# Patient Record
Sex: Male | Born: 1980
Health system: Southern US, Community
[De-identification: ages and names within clinical notes are randomized; demographics above are authoritative.]

---

## 2002-08-24 ENCOUNTER — Encounter: Payer: Self-pay | Admitting: General Surgery

## 2002-08-24 ENCOUNTER — Emergency Department (HOSPITAL_COMMUNITY): Admission: AC | Admit: 2002-08-24 | Discharge: 2002-08-24 | Payer: Self-pay

## 2005-01-15 ENCOUNTER — Emergency Department: Payer: Self-pay | Admitting: Emergency Medicine

## 2006-09-02 ENCOUNTER — Emergency Department (HOSPITAL_COMMUNITY): Admission: EM | Admit: 2006-09-02 | Discharge: 2006-09-02 | Payer: Self-pay | Admitting: Emergency Medicine

## 2009-03-03 ENCOUNTER — Emergency Department: Payer: Self-pay | Admitting: Emergency Medicine

## 2009-03-06 ENCOUNTER — Emergency Department (HOSPITAL_COMMUNITY): Admission: EM | Admit: 2009-03-06 | Discharge: 2009-03-06 | Payer: Self-pay | Admitting: Emergency Medicine

## 2009-11-17 ENCOUNTER — Emergency Department (HOSPITAL_COMMUNITY): Admission: EM | Admit: 2009-11-17 | Discharge: 2009-11-17 | Payer: Self-pay | Admitting: Emergency Medicine

## 2015-09-30 ENCOUNTER — Other Ambulatory Visit: Payer: Self-pay | Admitting: Otolaryngology

## 2015-09-30 DIAGNOSIS — R221 Localized swelling, mass and lump, neck: Secondary | ICD-10-CM

## 2015-10-04 ENCOUNTER — Ambulatory Visit
Admission: RE | Admit: 2015-10-04 | Discharge: 2015-10-04 | Disposition: A | Discharge status: Home / Self Care | Payer: Managed Care, Other (non HMO) | Source: Ambulatory Visit | Attending: Otolaryngology | Admitting: Otolaryngology

## 2015-10-04 DIAGNOSIS — R221 Localized swelling, mass and lump, neck: Secondary | ICD-10-CM | POA: Diagnosis not present

## 2015-10-04 MED ORDER — IOHEXOL 350 MG/ML SOLN
75.0000 mL | Freq: Once | INTRAVENOUS | Status: AC | PRN
  Administered 2015-10-04: 75 mL via INTRAVENOUS

## 2015-10-12 ENCOUNTER — Telehealth (HOSPITAL_COMMUNITY): Payer: Self-pay | Admitting: Otolaryngology

## 2016-10-14 IMAGING — CT CT NECK W/ CM
4 of 5 series · 15 of 33 positions shown, 18 images · IV contrast (omnipaque)
Comparison: None.

CLINICAL DATA: Right neck mass 2 months.

EXAM:
CT NECK WITH CONTRAST
TECHNIQUE: Multidetector CT imaging of the neck was performed using the
standard protocol following the bolus administration of intravenous
contrast.
CONTRAST:  75mL OMNIPAQUE IOHEXOL 350 MG/ML SOLN

[Series 2: axial · axial · 0.50mm/px · z∈[-354,-292]mm · 2 of 126 slices shown]
[im 32/126  bone]
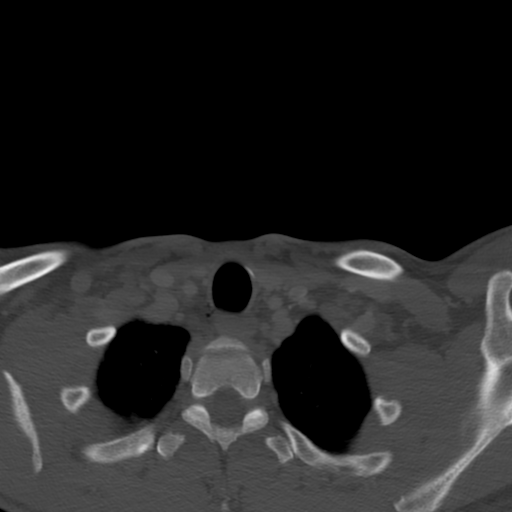
[im 63/126  bone]
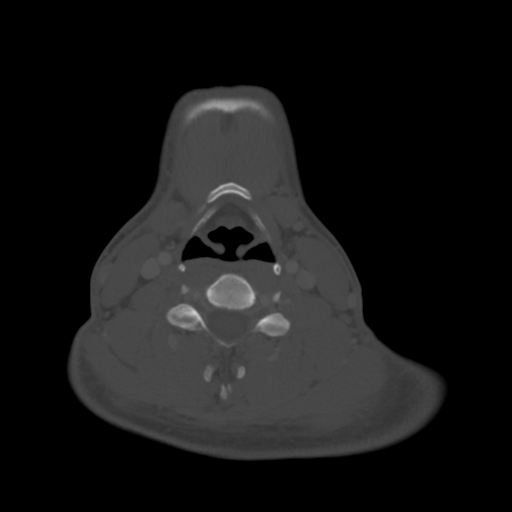

[Series 4: sag neck · sagittal · 0.49mm/px · 5 of 101 slices shown, 6 images]
[im 34/101  bone]
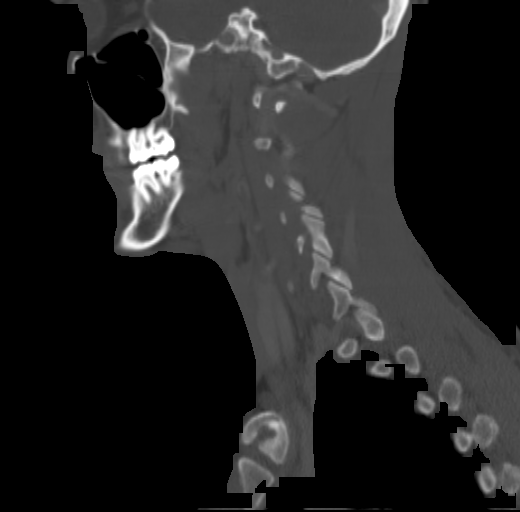
[im 42/101  bone]
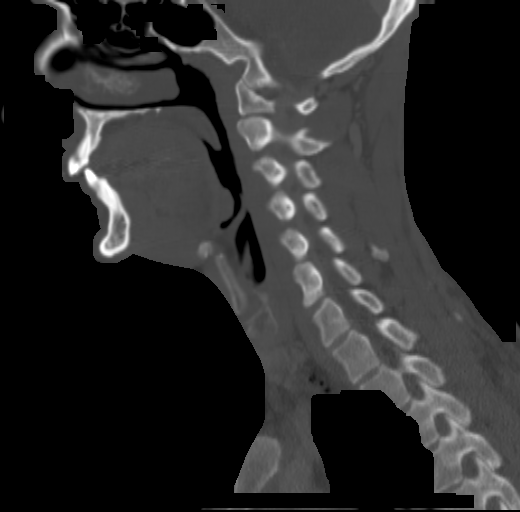
[im 51/101  soft-tissue]
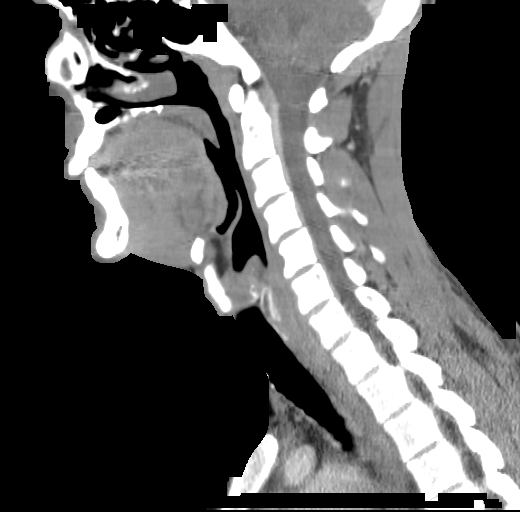
[im 51/101  bone]
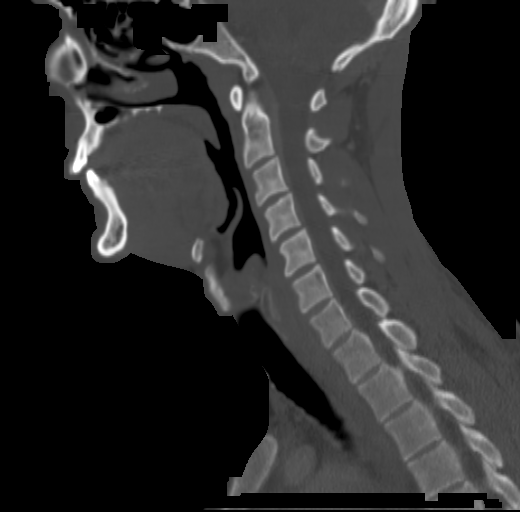
[im 59/101  bone]
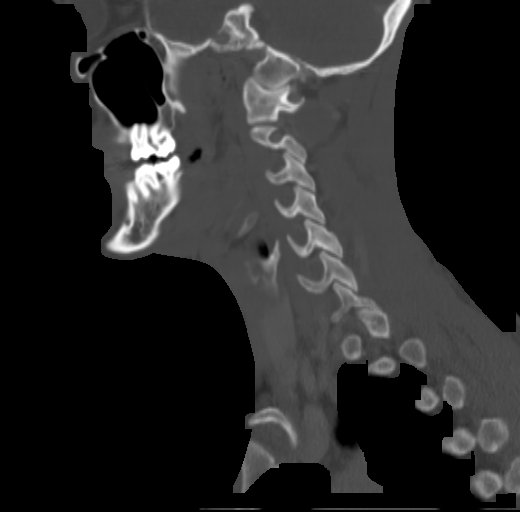
[im 67/101  bone]
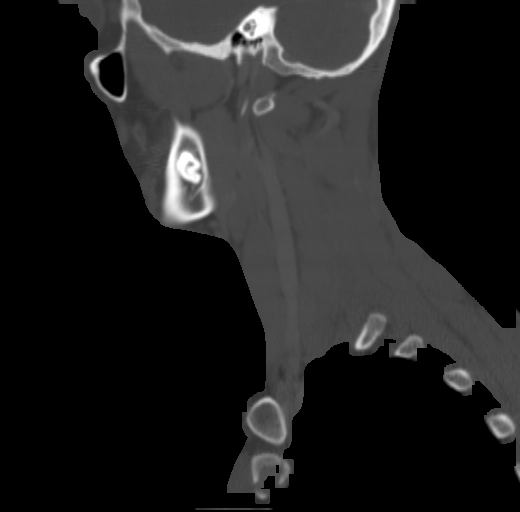

[Series 5: cor neck · coronal · 0.51mm/px · 3 of 113 slices shown]
[im 25/113  bone]
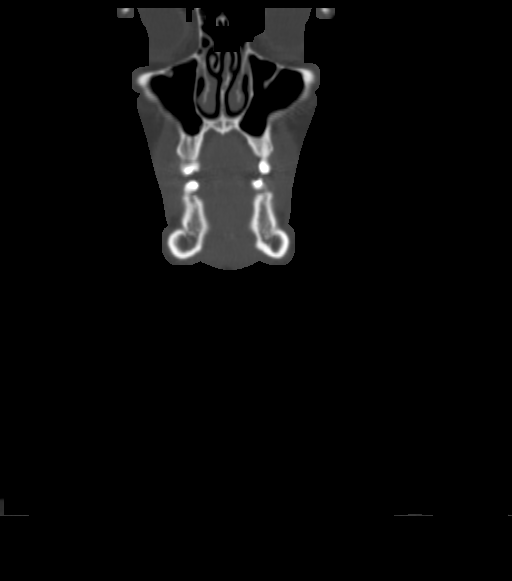
[im 46/113  bone]
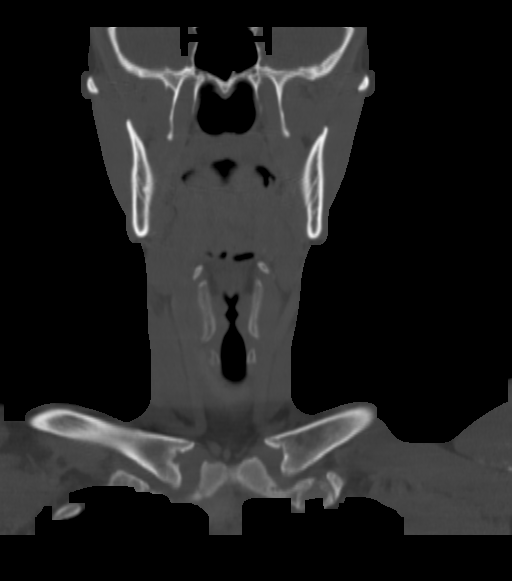
[im 67/113  bone]
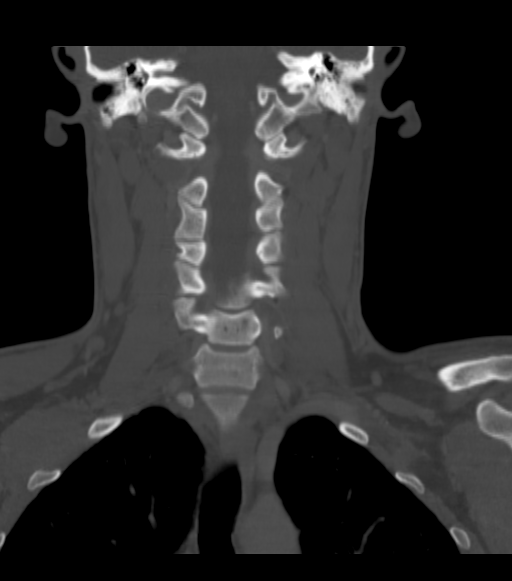

[Series 6: ax oropharynx · axial · 0.47mm/px · z∈[-431,-220]mm · 5 of 167 slices shown, 7 images]
[im 28/167  soft-tissue]
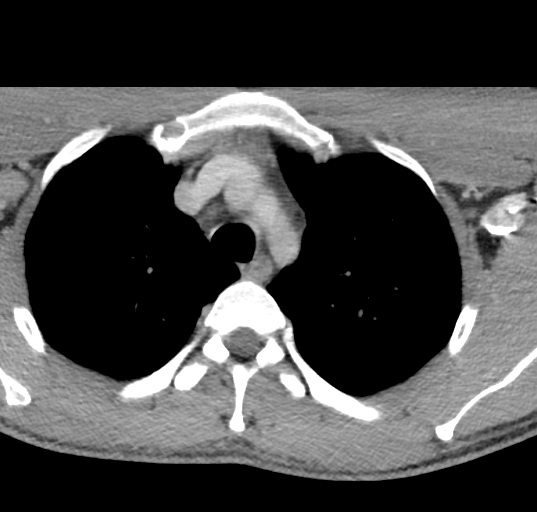
[im 28/167  bone]
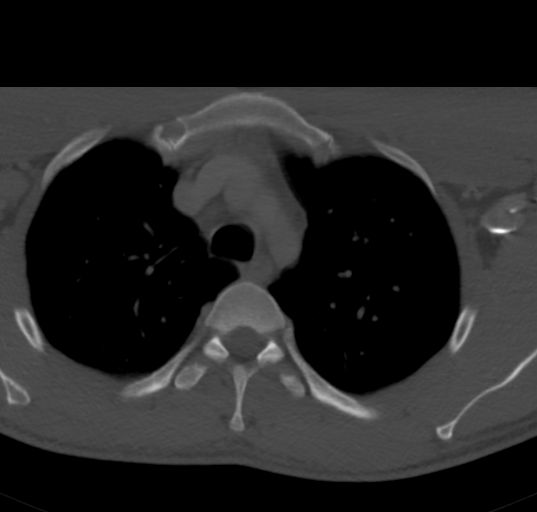
[im 56/167  bone]
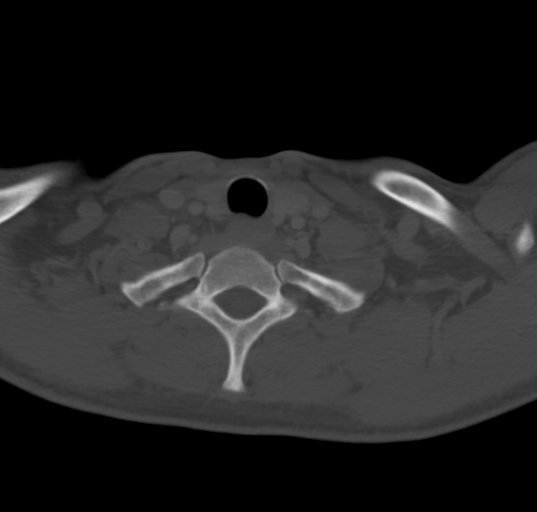
[im 84/167  bone]
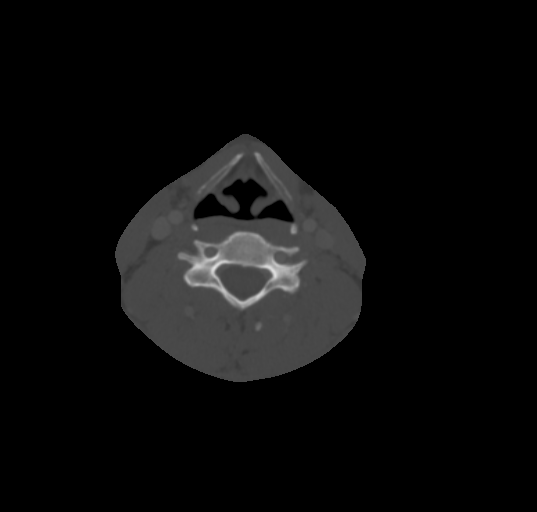
[im 111/167  bone]
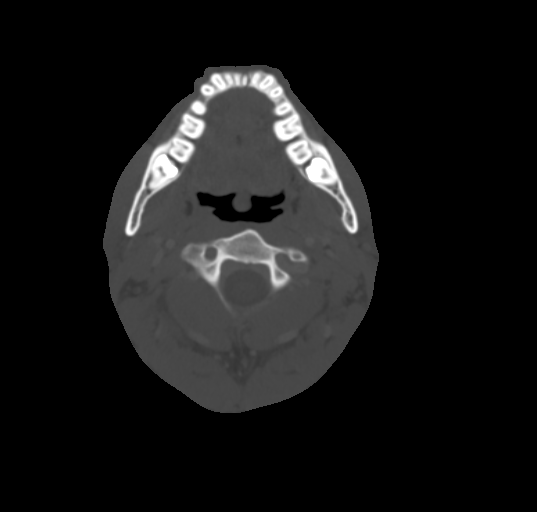
[im 139/167  soft-tissue]
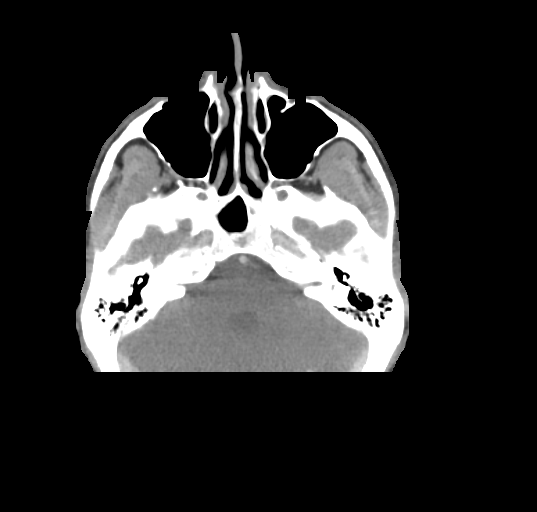
[im 139/167  bone]
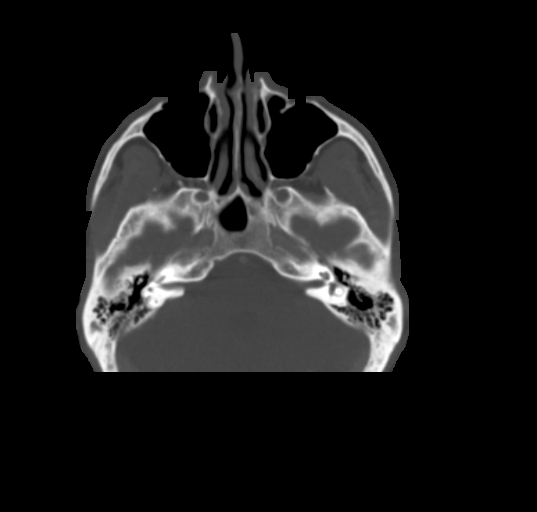

[15 of 33 positions shown; findings below may reference images not displayed]

FINDINGS: Pharynx and larynx: Negative

Salivary glands: Low-density area corresponds to a 15 mm mass
posterior to the tail of the parotid. This appears extra parotid but
has similar density to the parotid. No surrounding inflammation. No
calcification. Remainder of the parotid gland appears normal.

Left parotid normal.  Submandibular gland normal bilaterally.

Thyroid: Normal

Lymph nodes: Negative for cervical adenopathy.

Vascular: Normal

Limited intracranial: Fourth ventricular calcification compatible
with choroid plexus. No acute intracranial abnormality.

Visualized orbits: Negative

Mastoids and visualized paranasal sinuses: Negative

Skeleton: Negative

Upper chest: Negative
IMPRESSION: 15 mm low-density mass posterior to the tail of the parotid. This
has density similar to that of parotid and may be a cystic lesion
without surrounding enhancement or calcification. Lesion appears
extra parotid. I would favor this is a first branchial cleft cyst.
Other possibilities would include a parotid lesion such as
pleomorphic adenoma. Necrotic lymph node considered less likely.
Surgical evaluation suggested.

## 2016-12-27 ENCOUNTER — Encounter: Payer: Self-pay | Admitting: *Deleted

## 2016-12-27 ENCOUNTER — Emergency Department
Admission: EM | Admit: 2016-12-27 | Discharge: 2016-12-27 | Disposition: A | Discharge status: Home / Self Care | Payer: Managed Care, Other (non HMO) | Attending: Emergency Medicine | Admitting: Emergency Medicine

## 2016-12-27 DIAGNOSIS — F17210 Nicotine dependence, cigarettes, uncomplicated: Secondary | ICD-10-CM | POA: Diagnosis not present

## 2016-12-27 DIAGNOSIS — J329 Chronic sinusitis, unspecified: Secondary | ICD-10-CM | POA: Diagnosis not present

## 2016-12-27 DIAGNOSIS — R05 Cough: Secondary | ICD-10-CM | POA: Diagnosis present

## 2016-12-27 MED ORDER — AMOXICILLIN-POT CLAVULANATE 875-125 MG PO TABS: 1.0000 | ORAL_TABLET | Freq: Two times a day (BID) | ORAL | 0 refills | Status: AC

## 2016-12-27 MED ORDER — PREDNISONE 10 MG PO TABS: ORAL_TABLET | ORAL | 0 refills | Status: DC

## 2016-12-27 MED ORDER — BENZONATATE 100 MG PO CAPS: 200.0000 mg | ORAL_CAPSULE | Freq: Three times a day (TID) | ORAL | 0 refills | Status: AC | PRN

## 2016-12-27 NOTE — Discharge Instructions (Signed)
Call today to make an appointment with your primary care doctor for next week. Begin taking medication as directed. Prednisone taper, Augmentin 875 twice a day for 10 days and Tessalon Perles 2 every 8 hours. Increase fluids. Continue to decrease smoking.

## 2016-12-27 NOTE — ED Provider Notes (Signed)
[facility] Emergency Department Provider Note   ____________________________________________   First MD Initiated Contact with Patient 12/27/16 [phone]     (approximate)  I have reviewed the triage vital signs and the nursing notes.   HISTORY  Chief Complaint Cough    HPI Fernando Frazier is a 36 y.o. male has a cold symptoms since December. Patient states that 1224 he began having cold symptoms which has continued. He has productive cough in the mornings and states that the cough is worse during the night. He has been taking over-the-counter medication without any relief. He states that he developed a cough about one month ago. Patient was a pack-a-day smoker but recently has decreased greatly because of cough. Patient also finished his chemotherapy in August for lymphoma. Patient states that he's had multiple x-rays and/or PET and CAT scans.  Patient unaware of any fever.   History reviewed. No pertinent past medical history.  There are no active problems to display for this patient.   History reviewed. No pertinent surgical history.  Prior to Admission medications   Medication Sig Start Date End Date Taking? Authorizing Provider  amoxicillin-clavulanate (AUGMENTIN) 875-125 MG tablet Take 1 tablet by mouth 2 (two) times daily. 12/27/16 01/03/17  Tommi Rumpshonda L Nain Rudd, PA-C  benzonatate (TESSALON PERLES) 100 MG capsule Take 2 capsules (200 mg total) by mouth 3 (three) times daily as needed. 12/27/16 12/27/17  Tommi Rumpshonda L Chala Gul, PA-C  predniSONE (DELTASONE) 10 MG tablet Take 6 tablets  today, on day 2 take 5 tablets, day 3 take 4 tablets, day 4 take 3 tablets, day 5 take  2 tablets and 1 tablet the last day 12/27/16   Tommi Rumpshonda L Haji Delaine, PA-C    Allergies Patient has no known allergies.  History reviewed. No pertinent family history.  Social History Social History  Substance Use Topics  . Smoking status: Not on file  . Smokeless tobacco: Not on file  .  Alcohol use Not on file    Review of Systems Constitutional: No fever/chills Eyes: No visual changes. ENT: Occasionally sore throat. Cardiovascular: Denies chest pain. Respiratory: Denies shortness of breath. Productive cough a.m. and p.m. Gastrointestinal: No abdominal pain.  No nausea, no vomiting.   Skin: Negative for rash. Neurological: Negative for headaches, focal weakness or numbness.  10-point ROS otherwise negative.  ____________________________________________   PHYSICAL EXAM:  VITAL SIGNS: ED Triage Vitals  Enc Vitals Group     BP 12/27/16 0829 125/77     Pulse Rate 12/27/16 0829 82     Resp 12/27/16 0829 18     Temp 12/27/16 0829 97.4 F (36.3 C)     Temp Source 12/27/16 0829 Oral     SpO2 12/27/16 0829 98 %     Weight 12/27/16 0830 180 lb (81.6 kg)     Height 12/27/16 0830 6\' 1"  (1.854 m)     Head Circumference --      Peak Flow --      Pain Score --      Pain Loc --      Pain Edu? --      Excl. in [place]? --     Constitutional: Alert and oriented. Well appearing and in no acute distress. Eyes: Conjunctivae are normal. PERRL. EOMI. Head: Atraumatic. Nose: Mild congestion/no rhinnorhea.  EACs are clear. TMs are dull with mild effusion. No injection or erythema was noted. No tenderness is noted on percussion of the final and maxillary sinuses Mouth/Throat: Mucous membranes are moist.  Oropharynx non-erythematous. Posterior drainage present. Neck: No stridor.   Hematological/Lymphatic/Immunilogical: No cervical lymphadenopathy. Cardiovascular: Normal rate, regular rhythm. Grossly normal heart sounds.  Good peripheral circulation. Respiratory: Normal respiratory effort.  No retractions. Lungs CTAB. Musculoskeletal: Moves upper and lower extremities without difficulty. Normal gait was noted. Neurologic:  Normal speech and language. No gross focal neurologic deficits are appreciated. No gait instability. Skin:  Skin is warm, dry and intact. No rash  noted. Psychiatric: Mood and affect are normal. Speech and behavior are normal.  ____________________________________________   LABS (all labs ordered are listed, but only abnormal results are displayed)  Labs Reviewed - No data to display  PROCEDURES  Procedure(s) performed: None  Procedures  Critical Care performed: No  ____________________________________________   INITIAL IMPRESSION / ASSESSMENT AND PLAN / ED COURSE  Pertinent labs & imaging results that were available during my care of the patient were reviewed by me and considered in my medical decision making (see chart for details).   Patient was treated today for a sinusitis. He has no point tenderness to percussion but with posterior drainage and symptoms for almost 2 months we will treat and have him follow-up with his primary care doctor. Patient was given a prescription for Augmentin 875 twice a day for 10 days, prednisone taper, and Tessalon Perles as needed for cough. He is to increase fluids and also decrease smoking. Patient was encouraged to call and make an appointment with his primary care doctor for next week.     ____________________________________________   FINAL CLINICAL IMPRESSION(S) / ED DIAGNOSES  Final diagnoses:  Sinusitis, unspecified chronicity, unspecified location  Cigarette smoker      NEW MEDICATIONS STARTED DURING THIS VISIT:  Discharge Medication List as of 12/27/2016  9:25 AM    START taking these medications   Details  amoxicillin-clavulanate (AUGMENTIN) 875-125 MG tablet Take 1 tablet by mouth 2 (two) times daily., Starting Thu 12/27/2016, Until Thu 01/03/2017, Print    benzonatate (TESSALON PERLES) 100 MG capsule Take 2 capsules (200 mg total) by mouth 3 (three) times daily as needed., Starting Thu 12/27/2016, Until Fri 12/27/2017, Print    predniSONE (DELTASONE) 10 MG tablet Take 6 tablets  today, on day 2 take 5 tablets, day 3 take 4 tablets, day 4 take 3 tablets, day 5 take   2 tablets and 1 tablet the last day, Print         Note:  This document was prepared using Dragon voice recognition software and may include unintentional dictation errors.    Tommi Rumps, PA-C 12/27/16 1149    Emily Filbert, MD 12/27/16 [phone]

## 2016-12-27 NOTE — ED Triage Notes (Addendum)
States cold like symptoms since December, states productive mucous in the mornings, pt had chemo in August for lymphoma

## 2016-12-27 NOTE — ED Notes (Signed)
See triage note  States he developed a cough about 1 month ago states sx's are worse in the mornings  Did have fever intermittently in the beginning  Afebrile on arrival   States he is coughing up green mucous

## 2020-05-01 ENCOUNTER — Other Ambulatory Visit: Payer: Self-pay

## 2020-05-01 ENCOUNTER — Encounter: Payer: Self-pay | Admitting: Emergency Medicine

## 2020-05-01 ENCOUNTER — Emergency Department
Admission: EM | Admit: 2020-05-01 | Discharge: 2020-05-01 | Disposition: A | Discharge status: Home / Self Care | Payer: 59 | Attending: Emergency Medicine | Admitting: Emergency Medicine

## 2020-05-01 DIAGNOSIS — R21 Rash and other nonspecific skin eruption: Secondary | ICD-10-CM | POA: Insufficient documentation

## 2020-05-01 DIAGNOSIS — F17210 Nicotine dependence, cigarettes, uncomplicated: Secondary | ICD-10-CM | POA: Diagnosis not present

## 2020-05-01 MED ORDER — HYDROXYZINE HCL 10 MG PO TABS: 10.0000 mg | ORAL_TABLET | Freq: Three times a day (TID) | ORAL | 0 refills | Status: AC | PRN

## 2020-05-01 MED ORDER — PREDNISONE 50 MG PO TABS: ORAL_TABLET | ORAL | 0 refills | Status: AC

## 2020-05-01 MED ORDER — PREDNISONE 20 MG PO TABS
60.0000 mg | ORAL_TABLET | Freq: Once | ORAL | Status: AC
  Administered 2020-05-01: 60 mg via ORAL
  Filled 2020-05-01: qty 3

## 2020-05-01 NOTE — ED Notes (Signed)
Pt with red, itchy rash on back that started 1 week ago

## 2020-05-01 NOTE — ED Provider Notes (Signed)
[facility] Emergency Department Provider Note  ____________________________________________  Time seen: Approximately 8:47 AM  I have reviewed the triage vital signs and the nursing notes.   HISTORY  Chief Complaint Rash    HPI Fernando Frazier is a 39 y.o. male with no significant past medical history that presents to emergency department for evaluation of red itchy rash to back for 1 week. Patient states that rash started after he was mowing the lawn and had quit to take a shower. He noticed a semipainful rash to his left upper shoulder. Rash has since been itchy. It has spread throughout his back. He has a couple lesions on his arms and on his left knee. He has placed some hydrocortisone cream on it, which improved symptoms but rash did not disappear. His sinuses have been irritated this week and he has taken some allergy medication for his sinuses. He does not have any pets. He does not take any medications daily. No known insect bites. No tick bites. No one else has a rash. No new lotions, detergents, body washes, close. No new furniture. No fever, shortness of breath, chest pain, vomiting, abdominal pain. He was going to make an appointment with primary care but they are not open tomorrow for the holiday.  History reviewed. No pertinent past medical history.  There are no problems to display for this patient.   History reviewed. No pertinent surgical history.  Prior to Admission medications   Medication Sig Start Date End Date Taking? Authorizing Provider  hydrOXYzine (ATARAX/VISTARIL) 10 MG tablet Take 1 tablet (10 mg total) by mouth 3 (three) times daily as needed. for itching 05/01/20   Laban Emperor, PA-C  predniSONE (DELTASONE) 50 MG tablet Take 1 tablet per day 05/02/20   Laban Emperor, PA-C    Allergies Patient has no known allergies.  History reviewed. No pertinent family history.  Social History Social History   Tobacco Use  .  Smoking status: Heavy Tobacco Smoker  . Smokeless tobacco: Never Used  Substance Use Topics  . Alcohol use: Never  . Drug use: Not on file     Review of Systems  Constitutional: No fever/chills ENT: Positive for nasal congestion. Cardiovascular: No chest pain. Respiratory: No SOB. Gastrointestinal: No abdominal pain.  No nausea, no vomiting.  Musculoskeletal: Negative for musculoskeletal pain. Skin: Negative for abrasions, lacerations, ecchymosis. Positive for rash. Neurological: Negative for headaches   ____________________________________________   PHYSICAL EXAM:  VITAL SIGNS: ED Triage Vitals [05/01/20 0817]  Enc Vitals Group     BP 126/75     Pulse Rate 84     Resp 18     Temp 98 F (36.7 C)     Temp Source Oral     SpO2 100 %     Weight 170 lb (77.1 kg)     Height 6\' 1"  (1.854 m)     Head Circumference      Peak Flow      Pain Score 0     Pain Loc      Pain Edu?      Excl. in [place]?      Constitutional: Alert and oriented. Well appearing and in no acute distress. Eyes: Conjunctivae are normal. PERRL. EOMI. Head: Atraumatic. ENT:      Ears:      Nose: No congestion/rhinnorhea.      Mouth/Throat: Mucous membranes are moist.  Neck: No stridor. Cardiovascular: Normal rate, regular rhythm.  Good peripheral circulation. Respiratory: Normal respiratory effort without  tachypnea or retractions. Lungs CTAB. Good air entry to the bases with no decreased or absent breath sounds. Musculoskeletal: Full range of motion to all extremities. No gross deformities appreciated. Neurologic:  Normal speech and language. No gross focal neurologic deficits are appreciated.  Skin:  Skin is warm, dry. Few 1 cm macules scattered throughout back. Psychiatric: Mood and affect are normal. Speech and behavior are normal. Patient exhibits appropriate insight and judgement.   ____________________________________________   LABS (all labs ordered are listed, but only abnormal results  are displayed)  Labs Reviewed - No data to display ____________________________________________  EKG   ____________________________________________  RADIOLOGY   No results found.  ____________________________________________    PROCEDURES  Procedure(s) performed:    Procedures    Medications  predniSONE (DELTASONE) tablet 60 mg (60 mg Oral Given 05/01/20 0855)     ____________________________________________   INITIAL IMPRESSION / ASSESSMENT AND PLAN / ED COURSE  Pertinent labs & imaging results that were available during my care of the patient were reviewed by me and considered in my medical decision making (see chart for details).  Review of the [place] CSRS was performed in accordance of the [facility] prior to dispensing any controlled drugs.   Patient presented to the emergency department for evaluation of rash. Vital signs and exam are reassuring. Rash does appear allergic in origin. Less likely pityriasis rosea. Patient will be discharged home with prescriptions for prednisone. Patient is to follow up with primary care dermatology as directed. Patient is given ED precautions to return to the ED for any worsening or new symptoms.  Fernando Frazier was evaluated in Emergency Department on 05/01/2020 for the symptoms described in the history of present illness. He was evaluated in the context of the global COVID-19 pandemic, which necessitated consideration that the patient might be at risk for infection with the SARS-CoV-2 virus that causes COVID-19. Institutional protocols and algorithms that pertain to the evaluation of patients at risk for COVID-19 are in a state of rapid change based on information released by regulatory bodies including the CDC and federal and state organizations. These policies and algorithms were followed during the patient's care in the ED.   ____________________________________________  FINAL CLINICAL IMPRESSION(S) / ED DIAGNOSES  Final  diagnoses:  Rash      NEW MEDICATIONS STARTED DURING THIS VISIT:  ED Discharge Orders         Ordered    predniSONE (DELTASONE) 50 MG tablet     05/01/20 0901    hydrOXYzine (ATARAX/VISTARIL) 10 MG tablet  3 times daily PRN     05/01/20 0901              This chart was dictated using voice recognition software/Dragon. Despite best efforts to proofread, errors can occur which can change the meaning. Any change was purely unintentional.    Enid Derry, PA-C 05/01/20 1316    Phineas Semen, MD 05/01/20 [phone]

## 2020-05-01 NOTE — ED Triage Notes (Signed)
PT to ER with c/o rash to back for last week.  Pt states started on left shoulder blade and initially hurt.  States got better and now has gotten worse and spread.  States now itches.

## 2020-10-08 ENCOUNTER — Other Ambulatory Visit: Payer: Self-pay

## 2020-10-08 ENCOUNTER — Emergency Department
Admission: EM | Admit: 2020-10-08 | Discharge: 2020-10-08 | Disposition: A | Discharge status: Home / Self Care | Payer: 59 | Attending: Emergency Medicine | Admitting: Emergency Medicine

## 2020-10-08 DIAGNOSIS — F17200 Nicotine dependence, unspecified, uncomplicated: Secondary | ICD-10-CM | POA: Diagnosis not present

## 2020-10-08 DIAGNOSIS — H9201 Otalgia, right ear: Secondary | ICD-10-CM | POA: Diagnosis not present

## 2020-10-08 MED ORDER — AMOXICILLIN 875 MG PO TABS: 875.0000 mg | ORAL_TABLET | Freq: Two times a day (BID) | ORAL | 0 refills | Status: DC

## 2020-10-08 MED ORDER — AMOXICILLIN 875 MG PO TABS: 875.0000 mg | ORAL_TABLET | Freq: Two times a day (BID) | ORAL | 0 refills | Status: AC

## 2020-10-08 MED ORDER — CIPROFLOXACIN-DEXAMETHASONE 0.3-0.1 % OT SUSP: 4.0000 [drp] | Freq: Two times a day (BID) | OTIC | 0 refills | Status: DC

## 2020-10-08 MED ORDER — CIPROFLOXACIN-DEXAMETHASONE 0.3-0.1 % OT SUSP: 4.0000 [drp] | Freq: Two times a day (BID) | OTIC | 0 refills | Status: AC

## 2020-10-08 NOTE — Discharge Instructions (Signed)
Take Amoxicillin twice daily for the next ten days. Take Ciprodex twice daily for the next seven days.

## 2020-10-08 NOTE — ED Provider Notes (Signed)
Emergency Department Provider Note  ____________________________________________  Time seen: Approximately 10:11 PM  I have reviewed the triage vital signs and the nursing notes.   HISTORY  Chief Complaint Otalgia   Historian Patient     HPI Fernando Frazier is a 39 y.o. male  presents to the emergency department with right ear pain.  Patient states that 2 days ago he stepped out of the shower and felt a pop and states that his hearing became muffled.  He denies discharge from the right ear but states that his muffled hearing has persisted.  No fever or chills.  He denies recent illness and has not been swimming recently.   No past medical history on file.   Immunizations up to date:  Yes.     No past medical history on file.  There are no problems to display for this patient.   No past surgical history on file.  Prior to Admission medications   Medication Sig Start Date End Date Taking? Authorizing Provider  amoxicillin (AMOXIL) 875 MG tablet Take 1 tablet (875 mg total) by mouth 2 (two) times daily for 10 days. 10/08/20 10/18/20  Orvil Feil, PA-C  ciprofloxacin-dexamethasone (CIPRODEX) OTIC suspension Place 4 drops into the right ear 2 (two) times daily for 7 days. 10/08/20 10/15/20  Orvil Feil, PA-C  hydrOXYzine (ATARAX/VISTARIL) 10 MG tablet Take 1 tablet (10 mg total) by mouth 3 (three) times daily as needed. for itching 05/01/20   Enid Derry, PA-C  predniSONE (DELTASONE) 50 MG tablet Take 1 tablet per day 05/02/20   Enid Derry, PA-C    Allergies Patient has no known allergies.  No family history on file.  Social History Social History   Tobacco Use   Smoking status: Heavy Tobacco Smoker   Smokeless tobacco: Never Used  Substance Use Topics   Alcohol use: Never   Drug use: Not on file     Review of Systems  Constitutional: No fever/chills Eyes:  No discharge ENT: Patient has right ear pain.  Respiratory: no cough. No  SOB/ use of accessory muscles to breath Gastrointestinal:   No nausea, no vomiting.  No diarrhea.  No constipation. Musculoskeletal: Negative for musculoskeletal pain. Skin: Negative for rash, abrasions, lacerations, ecchymosis.    ____________________________________________   PHYSICAL EXAM:  VITAL SIGNS: ED Triage Vitals [10/08/20 2008]  Enc Vitals Group     BP 125/74     Pulse Rate 83     Resp 16     Temp 97.9 F (36.6 C)     Temp Source Oral     SpO2 100 %     Weight 170 lb (77.1 kg)     Height 6\' 1"  (1.854 m)     Head Circumference      Peak Flow      Pain Score 2     Pain Loc      Pain Edu?      Excl. in [place]?      Constitutional: Alert and oriented. Well appearing and in no acute distress. Eyes: Conjunctivae are normal. PERRL. EOMI. Head: Atraumatic. ENT:      Ears: Right TM is partially occluded by cerumen.  TM can be visualized between 12:00 and 5:00 and appears hemorrhagic with partial rupture.  No purulent exudate in external auditory canal.  No tenderness with palpation of the right tragus.      Nose: No congestion/rhinnorhea.      Mouth/Throat: Mucous membranes are moist.  Neck: No stridor.  No cervical spine tenderness to palpation. Cardiovascular: Normal rate, regular rhythm. Normal S1 and S2.  Good peripheral circulation. Respiratory: Normal respiratory effort without tachypnea or retractions. Lungs CTAB. Good air entry to the bases with no decreased or absent breath sounds Musculoskeletal: Full range of motion to all extremities. No obvious deformities noted Neurologic:  Normal for age. No gross focal neurologic deficits are appreciated.  Skin:  Skin is warm, dry and intact. No rash noted. Psychiatric: Mood and affect are normal for age. Speech and behavior are normal.   ____________________________________________   LABS (all labs ordered are listed, but only abnormal results are displayed)  Labs Reviewed - No data to  display ____________________________________________  EKG   ____________________________________________  RADIOLOGY   No results found.  ____________________________________________    PROCEDURES  Procedure(s) performed:     Procedures     Medications - No data to display   ____________________________________________   INITIAL IMPRESSION / ASSESSMENT AND PLAN / ED COURSE  Pertinent labs & imaging results that were available during my care of the patient were reviewed by me and considered in my medical decision making (see chart for details).      Assessment and plan Right ear pain 39 year old male presents to the emergency department with acute right ear pain for the past 2 to 3 days.  On physical exam, right TM appeared hemorrhagic from the 12:00 and 5 o'clock position with a small region of rupture.  Patient was discharged with amoxicillin and Ciprodex.  He was advised to follow-up with primary care if symptoms persist.  ____________________________________________  FINAL CLINICAL IMPRESSION(S) / ED DIAGNOSES  Final diagnoses:  Right ear pain      NEW MEDICATIONS STARTED DURING THIS VISIT:  ED Discharge Orders         Ordered    amoxicillin (AMOXIL) 875 MG tablet  2 times daily,   Status:  Discontinued        10/08/20 2149    ciprofloxacin-dexamethasone (CIPRODEX) OTIC suspension  2 times daily,   Status:  Discontinued        10/08/20 2149    amoxicillin (AMOXIL) 875 MG tablet  2 times daily        10/08/20 2150    ciprofloxacin-dexamethasone (CIPRODEX) OTIC suspension  2 times daily        10/08/20 2150              This chart was dictated using voice recognition software/Dragon. Despite best efforts to proofread, errors can occur which can change the meaning. Any change was purely unintentional.     Gasper Lloyd 10/08/20 2219    Delton Prairie, MD 10/09/20 0020

## 2020-10-08 NOTE — ED Triage Notes (Signed)
Patient reports after getting out of shower several days ago hear pop in right ear and then sound was muffled.  Today reports having pain to right ear.
